# Patient Record
Sex: Male | Born: 2000
Health system: Southern US, Community
[De-identification: ages and names within clinical notes are randomized; demographics above are authoritative.]

## PROBLEM LIST (undated history)

## (undated) DIAGNOSIS — Z91018 Allergy to other foods: Secondary | ICD-10-CM

## (undated) DIAGNOSIS — T783XXA Angioneurotic edema, initial encounter: Secondary | ICD-10-CM

## (undated) DIAGNOSIS — L509 Urticaria, unspecified: Secondary | ICD-10-CM

## (undated) HISTORY — DX: Allergy to other foods: Z91.018

## (undated) HISTORY — DX: Urticaria, unspecified: L50.9

## (undated) HISTORY — DX: Angioneurotic edema, initial encounter: T78.3XXA

## (undated) HISTORY — PX: TYMPANOSTOMY TUBE PLACEMENT: SHX32

---
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
## 2022-02-10 ENCOUNTER — Ambulatory Visit: Payer: BC Managed Care – PPO | Attending: Internal Medicine | Admitting: Internal Medicine

## 2022-02-10 ENCOUNTER — Encounter: Payer: Self-pay | Admitting: Internal Medicine

## 2022-02-10 VITALS — BP 122/67 | HR 57 | Resp 15 | Ht 71.5 in | Wt 167.8 lb

## 2022-02-10 DIAGNOSIS — R079 Chest pain, unspecified: Secondary | ICD-10-CM

## 2022-02-10 DIAGNOSIS — R590 Localized enlarged lymph nodes: Secondary | ICD-10-CM | POA: Insufficient documentation

## 2022-02-10 DIAGNOSIS — M2550 Pain in unspecified joint: Secondary | ICD-10-CM | POA: Insufficient documentation

## 2022-02-10 DIAGNOSIS — Z91018 Allergy to other foods: Secondary | ICD-10-CM

## 2022-02-10 NOTE — Progress Notes (Signed)
Office Visit Note  Patient: Cameron Delacruz             Date of Birth: 03-27-2001           MRN: [id number]             PCP: Sharilyn Sites, MD Referring: Sharilyn Sites, MD Visit Date: 02/10/2022 Occupation: Architect, [employer]  Subjective:  New Patient (Initial Visit) (Joint pain throughout body, some tingling sensations on left side of body.)   History of Present Illness: Cameron Delacruz is a 21 y.o. male here for arthralgias with history of alpha gal related symptoms. He had highly positive test results in 2019 for this.  He avoid meats as recommended for this.  More recently negative testing for [place] or chicken sensitivity based on symptoms.  He still notices symptoms when eating protein even from not mammalian meat animal sources typically has numbness and tingling especially on the left side of his face.  He has been noticing these problems for about 8 months of symptoms.  Also having joint pains what he describes like an ache in his bones most severe in arms and legs.  Feels about 1 to 2 hours of morning stiffness on a daily basis.  He takes NSAIDs some of the time these are beneficial but does not take them all the time hoping to avoid side effects.  He was also sick with mono about 6 months ago and developed significant lymphadenopathy which has not completely resolved.  Activities of Daily Living:  Patient reports morning stiffness for 1-2 hours.   Patient Denies nocturnal pain.  Difficulty dressing/grooming: Denies Difficulty climbing stairs: Denies Difficulty getting out of chair: Denies Difficulty using hands for taps, buttons, cutlery, and/or writing: Denies  Review of Systems  Constitutional:  Positive for fatigue.  HENT:  Negative for mouth sores and mouth dryness.   Respiratory:  Positive for shortness of breath.   Cardiovascular:  Positive for chest pain and palpitations.  Gastrointestinal:  Negative for blood in stool, constipation and diarrhea.  Endocrine:  Negative for increased urination.  Genitourinary:  Negative for involuntary urination.  Musculoskeletal:  Positive for joint pain, joint pain, myalgias, muscle weakness, morning stiffness, muscle tenderness and myalgias. Negative for gait problem and joint swelling.  Skin:  Positive for rash. Negative for color change, hair loss and sensitivity to sunlight.  Allergic/Immunologic: Positive for susceptible to infections.  Neurological:  Positive for dizziness and headaches.  Hematological:  Positive for swollen glands.  Psychiatric/Behavioral:  Negative for depressed mood and sleep disturbance. The patient is nervous/anxious.     PMFS History:  Patient Active Problem List   Diagnosis Date Noted   Polyarthralgia 02/10/2022   Allergy to alpha-gal 02/10/2022   Chest pain 02/10/2022   Lymphadenopathy, cervical 02/10/2022   Allergic urticaria 10/05/2017   Allergy with anaphylaxis due to food 10/05/2017   Seasonal allergic rhinitis 10/05/2017   Dyspnea 10/05/2017    Past Medical History:  Diagnosis Date   Angio-edema    Food allergy    Urticaria     Family History  Problem Relation Age of Onset   Asthma Mother    Allergic rhinitis Mother    Eczema Mother    Past Surgical History:  Procedure Laterality Date   TYMPANOSTOMY TUBE PLACEMENT     Social History   Social History Narrative   Not on file   Immunization History  Administered Date(s) Administered   Influenza Nasal 01/12/2013     Objective: Vital Signs: BP  122/67 (BP Location: Right Arm, Patient Position: Sitting, Cuff Size: Normal)   Pulse (!) 57   Resp 15   Ht 5' 11.5" (1.816 m)   Wt 167 lb 12.8 oz (76.1 kg)   BMI 23.08 kg/m    Physical Exam HENT:     Right Ear: External ear normal.     Left Ear: External ear normal.     Mouth/Throat:     Mouth: Mucous membranes are moist.     Pharynx: Oropharynx is clear.  Eyes:     Conjunctiva/sclera: Conjunctivae normal.  Cardiovascular:     Rate and Rhythm: Normal  rate and regular rhythm.  Pulmonary:     Effort: Pulmonary effort is normal.     Breath sounds: Normal breath sounds.  Musculoskeletal:     Right lower leg: No edema.     Left lower leg: No edema.  Lymphadenopathy:     Cervical: No cervical adenopathy.  Skin:    General: Skin is warm and dry.     Findings: Rash present.     Comments: Flat, blanching erythema flexor side of left elbow Scattered mildly raised bumps on right forearm Callus on palms of both hands  Neurological:     Mental Status: He is alert.  Psychiatric:        Mood and Affect: Mood normal.      Musculoskeletal Exam:  Neck full ROM no tenderness Shoulders full ROM no tenderness or swelling Elbows full ROM no tenderness or swelling Wrists full ROM no tenderness or swelling Fingers full ROM no tenderness or swelling No paraspinal tenderness to palpation over upper and lower back Hip normal internal and external rotation without pain, no tenderness to lateral hip palpation Knees full ROM no tenderness or swelling Ankles full ROM no tenderness or swelling MTPs full ROM no tenderness or swelling   Investigation: No additional findings.  Imaging: No results found.  Recent Labs: No results found for: "WBC", "HGB", "PLT", "NA", "K", "CL", "CO2", "GLUCOSE", "BUN", "CREATININE", "BILITOT", "ALKPHOS", "AST", "ALT", "PROT", "ALBUMIN", "CALCIUM", "GFRAA", "QFTBGOLD", "QFTBGOLDPLUS"  Speciality Comments: No specialty comments available.  Procedures:  No procedures performed Allergies: Alpha-gal   Assessment / Plan:     Visit Diagnoses: Polyarthralgia - Plan: Sedimentation rate, C-reactive protein, Epstein-Barr virus VCA antibody panel, Rocky mtn spotted fvr abs pnl(IgG+IgM), B. Burgdorfi Antibodies by WB, Rheumatoid factor, ANA, C3 and C4, meloxicam (MOBIC) 15 MG tablet  Somewhat nonspecific symptoms with no peripheral synovitis appreciated on exam today.  He does describe several symptoms concerning for  inflammatory disease such as the prolonged morning stiffness episodic swelling.  Will check broad workup including ANA, rheumatoid arthritis antibodies, serum complements sedimentation rate CRP, also EBV panel [facility] spotted fever panel and Lyme panel.  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  If workup is unremarkable may recommend conservative treatment with more long-acting NSAID and observation.  Allergy to alpha-gal  Says that he has been pretty diligent with diet but is still experiencing symptoms even from unrelated food sources.  Chest pain, unspecified type  Chest pain without any red flag symptoms no respiratory problems or dyspnea, not reproducible on physical exam today for chest wall pain.  Lymphadenopathy, cervical  Seems consistent with mononucleosis infection currently lymph nodes are palpable but all less than 1 cm without concerning features.  Orders: Orders Placed This Encounter  Procedures   Sedimentation rate   C-reactive  protein   Epstein-Barr virus VCA antibody panel   Rocky mtn spotted fvr abs pnl(IgG+IgM)   B. Burgdorfi Antibodies by WB   Rheumatoid factor   ANA   C3 and C4   Anti-nuclear ab-titer (ANA titer)   Meds ordered this encounter  Medications   meloxicam (MOBIC) 15 MG tablet    Sig: Take 1 tablet (15 mg total) by mouth daily as needed for pain.    Dispense:  30 tablet    Refill:  2     Follow-Up Instructions: Return in about 3 months (around 05/13/2022) for Multiple joint pains, numbness on face.   Collier Salina, MD  Note - This record has been created using [employer].  Chart creation errors have been sought, but may not always  have been located. Such creation errors do not reflect on  the standard of medical care.

## 2022-02-11 ENCOUNTER — Ambulatory Visit: Payer: BC Managed Care – PPO | Admitting: Internal Medicine

## 2022-02-13 LAB — B. BURGDORFI ANTIBODIES BY WB

## 2022-02-13 LAB — EPSTEIN-BARR VIRUS VCA ANTIBODY PANEL
EBV NA IgG: 571 U/mL — ABNORMAL HIGH
EBV VCA IgG: 207 U/mL — ABNORMAL HIGH
EBV VCA IgM: 45.6 U/mL — ABNORMAL HIGH

## 2022-02-13 LAB — ANA: Anti Nuclear Antibody (ANA): POSITIVE — AB

## 2022-02-13 LAB — C-REACTIVE PROTEIN: CRP: 0.4 mg/L (ref <8.0)

## 2022-02-13 LAB — ROCKY MTN SPOTTED FVR ABS PNL(IGG+IGM)
RMSF IgG: NOT DETECTED
RMSF IgM: NOT DETECTED

## 2022-02-13 LAB — C3 AND C4
C3 Complement: 104 mg/dL (ref 82–185)
C4 Complement: 20 mg/dL (ref 15–53)

## 2022-02-13 LAB — RHEUMATOID FACTOR: Rhuematoid fact SerPl-aCnc: <14 IU/mL (ref <14)

## 2022-02-13 LAB — ANTI-NUCLEAR AB-TITER (ANA TITER): ANA Titer 1: 1:80 {titer} — ABNORMAL HIGH

## 2022-02-13 LAB — SEDIMENTATION RATE: Sed Rate: 28 mm/h — ABNORMAL HIGH (ref 0–15)

## 2022-02-13 MED ORDER — MELOXICAM 15 MG PO TABS: 15.0000 mg | ORAL_TABLET | Freq: Every day | ORAL | 2 refills | Status: AC | PRN

## 2022-02-13 NOTE — Progress Notes (Signed)
Lab results look consistent with recent mono infection from earlier this year. Negative for tick borne illnesses. He has a borderline positive ANA test but I think this is less likely indicating an autoimmune disease causing his symptoms. We can recheck for some causes if symptoms are still the same or worse in a few months as planned. I will send a prescription for meloxicam in the meantime. This is a longer acting antiinflammatory drug he can try using instead of things like ibuprofen or aleve. Take up to once daily as needed. He should take this with food to avoid risk of stomach irritation.

## 2022-05-14 ENCOUNTER — Ambulatory Visit: Payer: BC Managed Care – PPO | Admitting: Internal Medicine

## 2023-03-10 IMAGING — DX DG CHEST 2V
2 series · 2 of 2 positions shown · non-contrast
Comparison: February 07, 2004

CLINICAL DATA: Chest pain for 3 or 4 months.

EXAM:
CHEST - 2 VIEW

[chest pa]
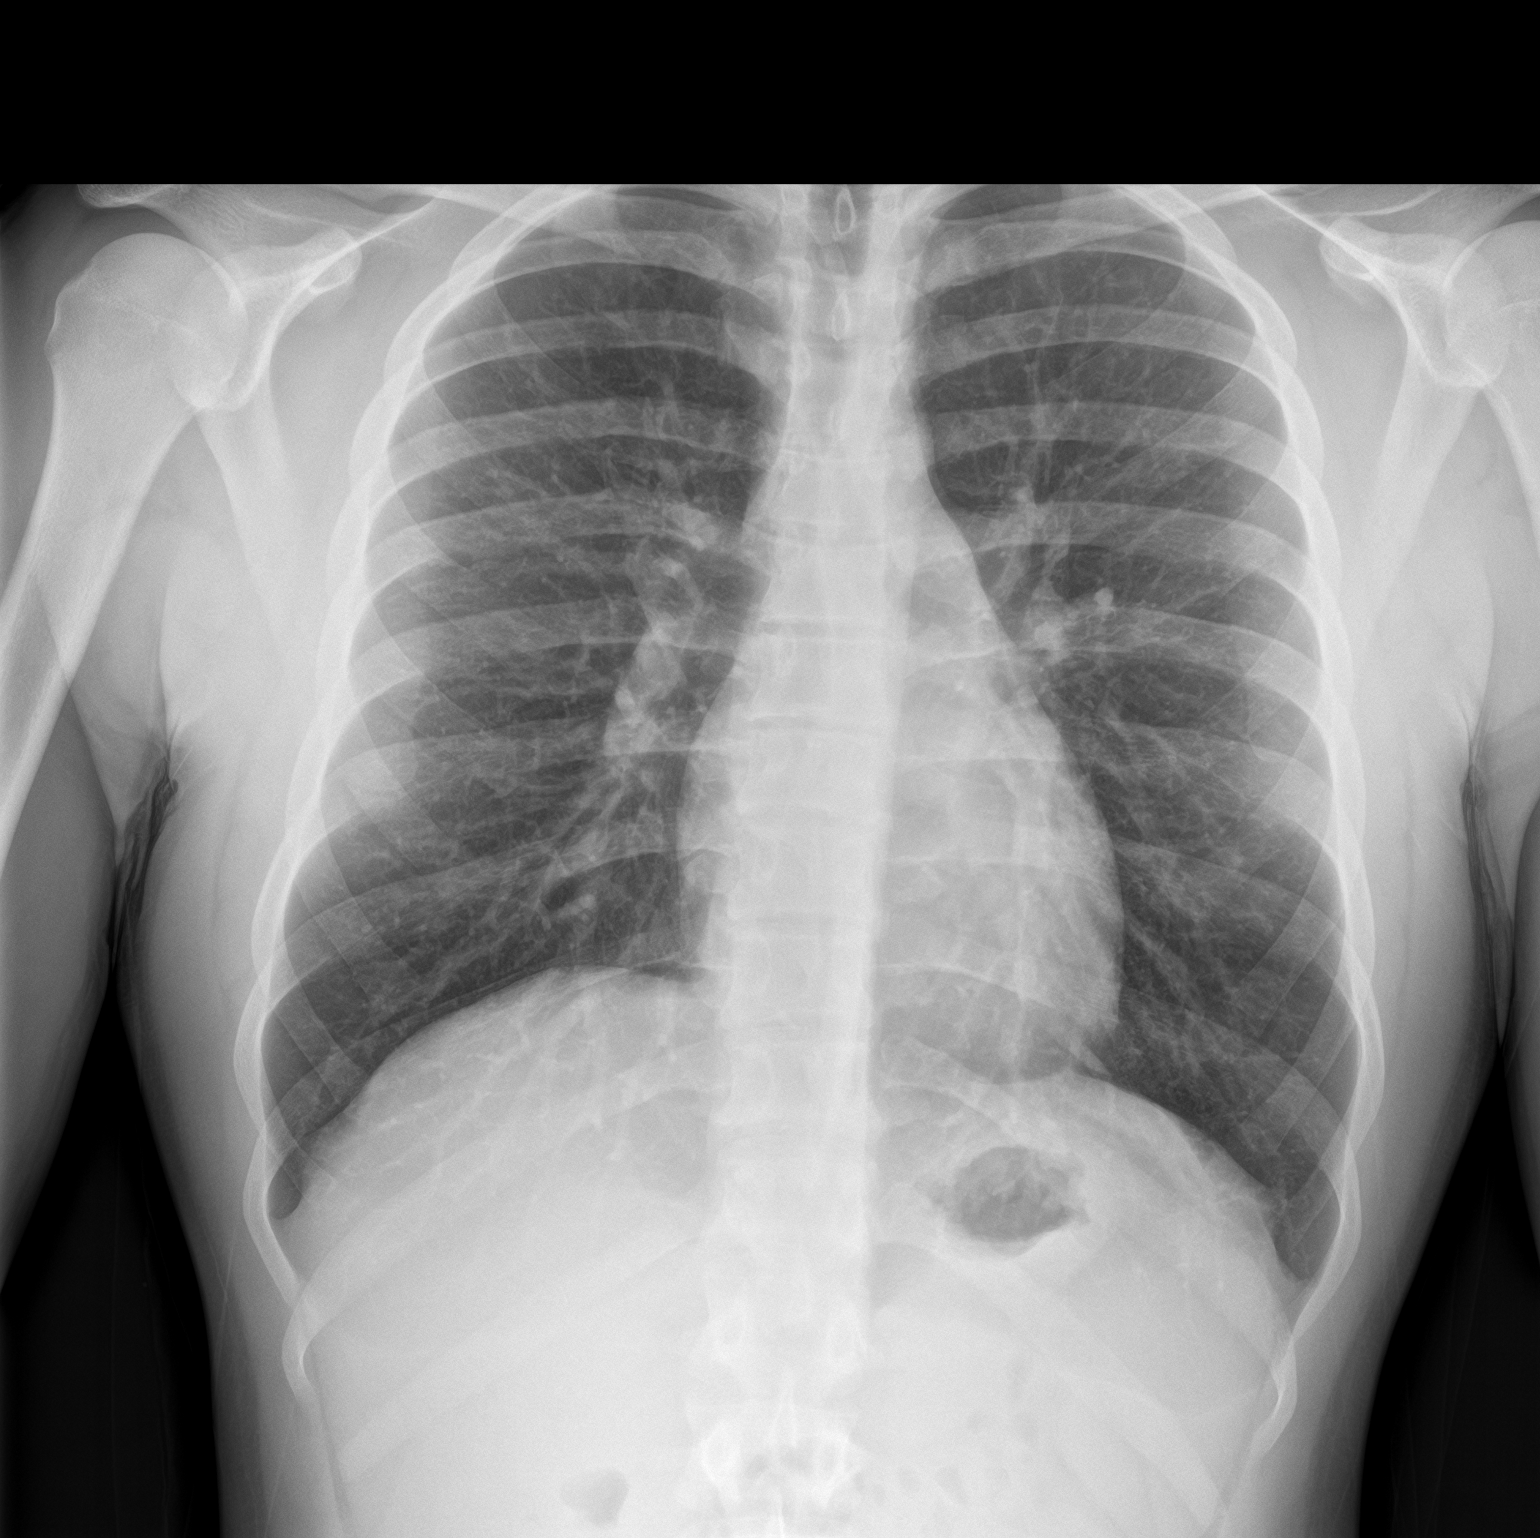

[chest lat]
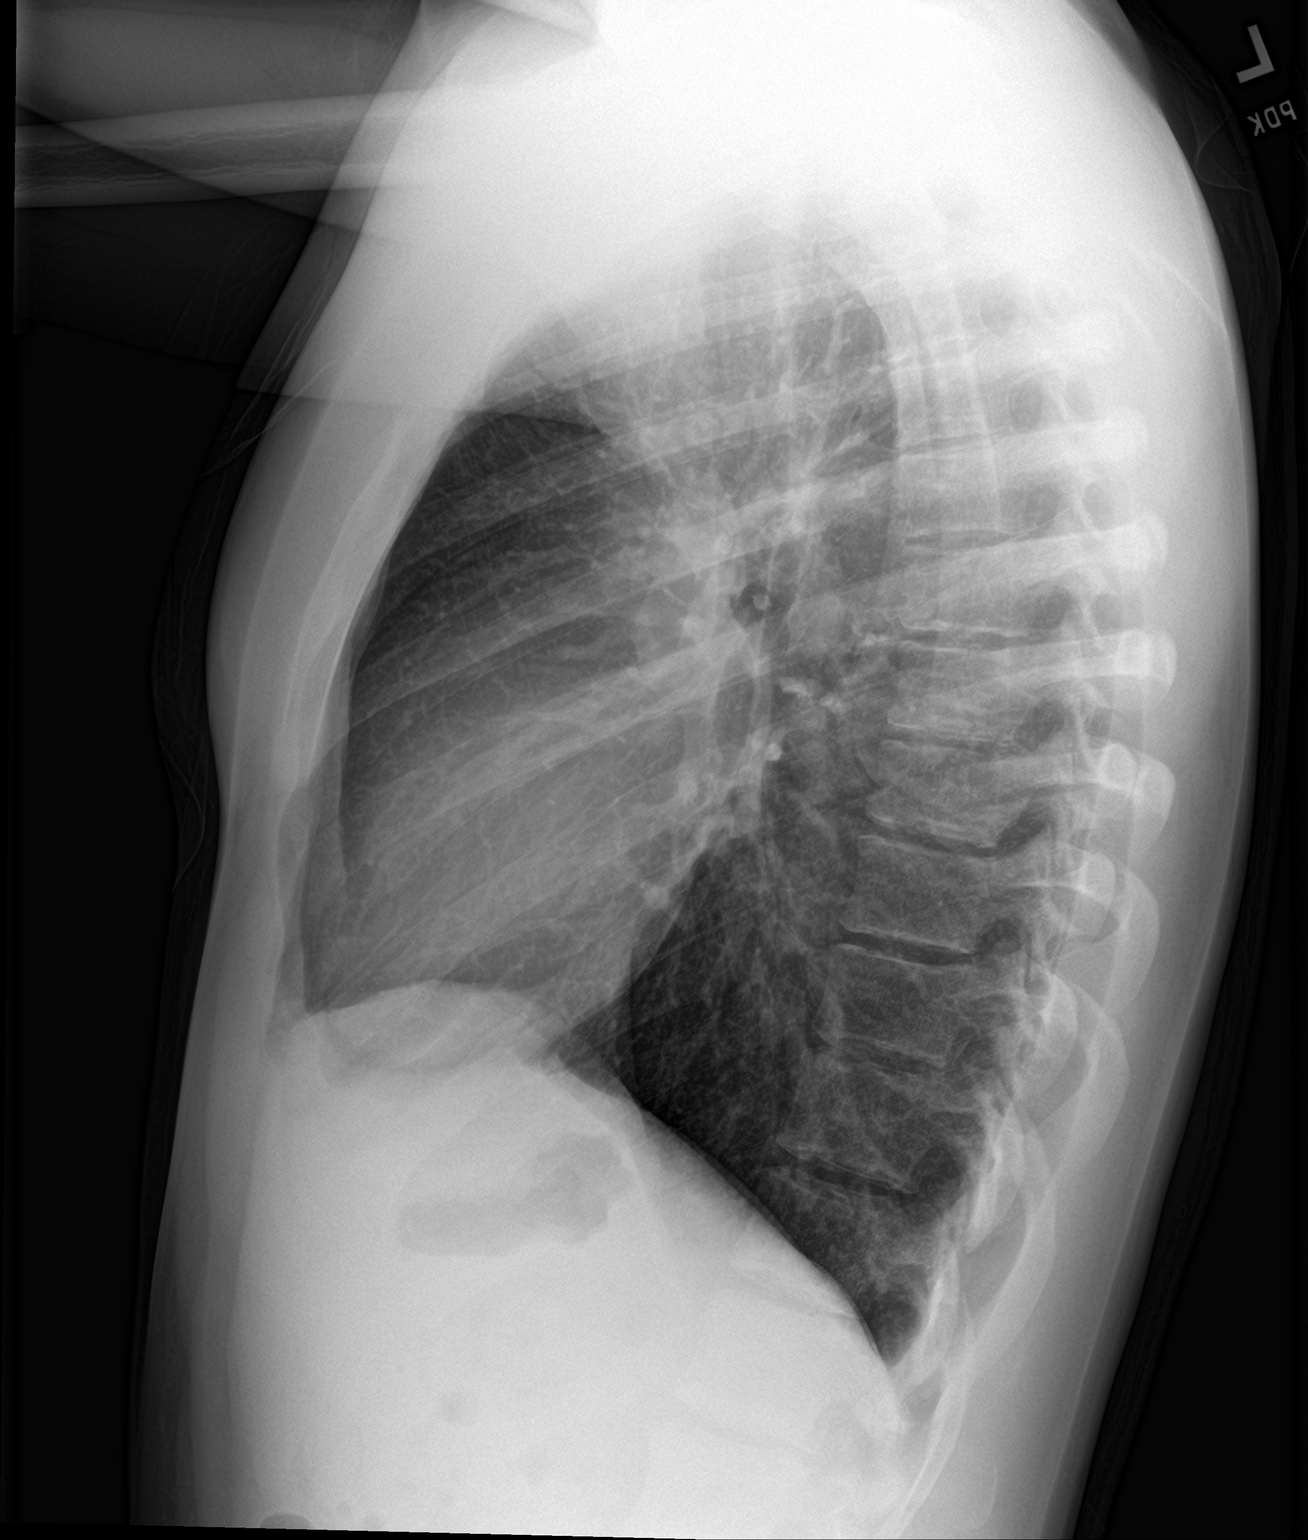

[2 of 2 positions shown; findings below may reference images not displayed]

FINDINGS: The heart size and mediastinal contours are within normal limits.
Both lungs are clear. The visualized skeletal structures are
unremarkable.
IMPRESSION: No active cardiopulmonary disease.

## 2024-04-19 ENCOUNTER — Other Ambulatory Visit: Payer: Self-pay

## 2024-04-19 ENCOUNTER — Encounter: Payer: Self-pay | Admitting: Emergency Medicine

## 2024-04-19 ENCOUNTER — Ambulatory Visit
Admission: EM | Admit: 2024-04-19 | Discharge: 2024-04-19 | Disposition: A | Discharge status: Home / Self Care | Attending: Nurse Practitioner | Admitting: Nurse Practitioner

## 2024-04-19 DIAGNOSIS — R1310 Dysphagia, unspecified: Secondary | ICD-10-CM

## 2024-04-19 NOTE — ED Triage Notes (Signed)
 Pt reports feeling as if food is getting stuck x1 year. Reports most recent episode last night after eating dinner. Pt alert, oriented, airway patent.NAD noted.

## 2024-04-19 NOTE — Discharge Instructions (Addendum)
 A referral has been placed for you to gastroenterology.  Please call their office to schedule an appointment. Make sure you are eating and chewing your food well.  Recommend eating 6 small meals per day instead of 3 large meals.  Also recommend eating at least 2 to 3 hours before bedtime or sitting up at least 2 to 3 hours after you eat before going to bed. Avoid foods that may trigger your symptoms. Go to the emergency department if you develop difficulty swallowing with shortness of breath, difficulty breathing, chest pain, or other concerns. Follow-up as needed.

## 2024-04-19 NOTE — ED Provider Notes (Signed)
 " [facility]    CSN: [id number] Arrival date & time: 04/19/24  1334      History   Chief Complaint Chief Complaint  Patient presents with   Dysphagia    HPI Cameron Delacruz is a 24 y.o. male.   The history is provided by the patient.   Patient presents for complaints of difficulty swallowing food.  Patient states symptoms have been present for the past year.  He states last evening, he tried eating a small amount of spicy foods, and had difficulty swallowing it.  He states that he feels like there is food stuck in his throat and in his upper chest.  He denies shortness of breath, difficulty breathing, abdominal pain, nausea, vomiting, diarrhea.  He states that he does have intermittent sore throat from time to time.  He states that his mother and father have a history of having to have their esophagus stretched.  He has never been evaluated for his symptoms.  Past Medical History:  Diagnosis Date   Angio-edema    Food allergy     Urticaria     Patient Active Problem List   Diagnosis Date Noted   Polyarthralgia 02/10/2022   Allergy  to alpha-gal 02/10/2022   Chest pain 02/10/2022   Lymphadenopathy, cervical 02/10/2022   Allergic urticaria 10/05/2017   Allergy  with anaphylaxis due to food 10/05/2017   Seasonal allergic rhinitis 10/05/2017   Dyspnea 10/05/2017    Past Surgical History:  Procedure Laterality Date   TYMPANOSTOMY TUBE PLACEMENT         Home Medications    Prior to Admission medications  Medication Sig Start Date End Date Taking? Authorizing Provider  cetirizine  (ZYRTEC  ALLERGY ) 10 MG tablet Take 1 tablet (10 mg total) by mouth daily. Patient not taking: Reported on 10/17/2021 02/09/21   Christopher Savannah, PA-C  EPINEPHrine  0.3 mg/0.3 mL IJ SOAJ injection Inject 0.3 mg into the muscle as needed for anaphylaxis. 10/17/21   Jeneal Danita Macintosh, MD  meloxicam  (MOBIC ) 15 MG tablet Take 1 tablet (15 mg total) by mouth daily as needed for pain.  02/13/22   Rice, Lonni ORN, MD    Family History Family History  Problem Relation Age of Onset   Asthma Mother    Allergic rhinitis Mother    Eczema Mother     Social History Social History[1]   Allergies   Alpha-gal   Review of Systems Review of Systems Per HPI  Physical Exam Triage Vital Signs ED Triage Vitals  Encounter Vitals Group     BP 04/19/24 1349 129/71     Girls Systolic BP Percentile --      Girls Diastolic BP Percentile --      Boys Systolic BP Percentile --      Boys Diastolic BP Percentile --      Pulse Rate 04/19/24 1349 63     Resp 04/19/24 1349 20     Temp 04/19/24 1349 98.6 F (37 C)     Temp Source 04/19/24 1349 Oral     SpO2 04/19/24 1349 98 %     Weight --      Height --      Head Circumference --      Peak Flow --      Pain Score 04/19/24 1348 0     Pain Loc --      Pain Education --      Exclude from Growth Chart --    No data found.  Updated Vital Signs  BP 129/71 (BP Location: Right Arm)   Pulse 63   Temp 98.6 F (37 C) (Oral)   Resp 20   SpO2 98%   Visual Acuity Right Eye Distance:   Left Eye Distance:   Bilateral Distance:    Right Eye Near:   Left Eye Near:    Bilateral Near:     Physical Exam Vitals and nursing note reviewed.  Constitutional:      General: He is not in acute distress.    Appearance: Normal appearance.  HENT:     Head: Normocephalic.     Mouth/Throat:     Lips: Pink.     Mouth: Mucous membranes are moist.     Tongue: No lesions.     Pharynx: Oropharynx is clear. Uvula midline. No posterior oropharyngeal erythema.     Comments: Airway is clear and patent, no obstruction present. Eyes:     Extraocular Movements: Extraocular movements intact.     Pupils: Pupils are equal, round, and reactive to light.  Cardiovascular:     Rate and Rhythm: Normal rate and regular rhythm.     Pulses: Normal pulses.     Heart sounds: Normal heart sounds.  Pulmonary:     Effort: Pulmonary effort is normal.  No respiratory distress.     Breath sounds: Normal breath sounds. No stridor. No wheezing, rhonchi or rales.  Abdominal:     General: Bowel sounds are normal.     Palpations: Abdomen is soft.  Musculoskeletal:     Cervical back: Normal range of motion.  Skin:    General: Skin is warm and dry.  Neurological:     General: No focal deficit present.     Mental Status: He is alert and oriented to person, place, and time.  Psychiatric:        Mood and Affect: Mood normal.        Behavior: Behavior normal.      UC Treatments / Results  Labs (all labs ordered are listed, but only abnormal results are displayed) Labs Reviewed - No data to display  EKG   Radiology No results found.  Procedures Procedures (including critical care time)  Medications Ordered in UC Medications - No data to display  Initial Impression / Assessment and Plan / UC Course  I have reviewed the triage vital signs and the nursing notes.  Pertinent labs & imaging results that were available during my care of the patient were reviewed by me and considered in my medical decision making (see chart for details).  Patient presents with a 1 year history of difficulty swallowing.  Advised patient that this clinic has very limited resources regarding his current symptoms.  Given his symptoms and history, we will place referral to gastroenterology for further evaluation.  Patient was advised he will need to call to schedule the appointment.  Supportive care recommendations were provided and discussed with the patient to include chewing his food well, and avoiding large intake of food at 1 time.  Eating 6 smaller meals instead of 3 large meals, eating at least 2 to 3 hours before bedtime, and avoiding spicy foods, or other foods that may trigger symptoms.  Patient was also given strict ER follow-up precautions.  Patient was in agreement with this plan of care and verbalizes understanding.  All questions were answered.   Patient stable for discharge.  Final Clinical Impressions(s) / UC Diagnoses   Final diagnoses:  Dysphagia, unspecified type     Discharge Instructions  A referral has been placed for you to gastroenterology.  Please call their office to schedule an appointment. Make sure you are eating and chewing your food well.  Recommend eating 6 small meals per day instead of 3 large meals.  Also recommend eating at least 2 to 3 hours before bedtime or sitting up at least 2 to 3 hours after you eat before going to bed. Avoid foods that may trigger your symptoms. Go to the emergency department if you develop difficulty swallowing with shortness of breath, difficulty breathing, chest pain, or other concerns. Follow-up as needed.     ED Prescriptions   None    PDMP not reviewed this encounter.     [1]  Social History Tobacco Use   Smoking status: Never    Passive exposure: Never   Smokeless tobacco: Never  Vaping Use   Vaping status: Never Used  Substance Use Topics   Alcohol use: Never   Drug use: Never     Gilmer Etta PARAS, NP 04/19/24 1416  "

## 2024-04-20 ENCOUNTER — Other Ambulatory Visit: Payer: Self-pay

## 2024-04-20 ENCOUNTER — Emergency Department (HOSPITAL_COMMUNITY)
Admission: EM | Admit: 2024-04-20 | Discharge: 2024-04-20 | Disposition: A | Discharge status: Home / Self Care | Attending: Emergency Medicine | Admitting: Emergency Medicine

## 2024-04-20 ENCOUNTER — Encounter (HOSPITAL_COMMUNITY): Payer: Self-pay | Admitting: Emergency Medicine

## 2024-04-20 DIAGNOSIS — R070 Pain in throat: Secondary | ICD-10-CM | POA: Diagnosis present

## 2024-04-20 NOTE — ED Provider Notes (Signed)
 " [place] [facility] AT [facility] Provider Note   CSN: [id number] Arrival date & time: 04/20/24  9068     Patient presents with: Sore Throat   Cameron Delacruz is a 24 y.o. male.   Patient reports that he ate a bean burrito 2 days ago and felt like it got stuck in his throat.  Patient states that he has had the sensation of having something in his throat since.  He is able to drink water.  Patient states he has not ate anything solid he has been drinking protein shakes.  Patient reports that he is concerned that he has a narrow esophagus.  Apparently patient's family members have had strictures that have required dilation.  Patient was seen at urgent care yesterday and advised if symptoms persist to come to the emergency department.  Patient denies any fever he denies any chills.  He reports he was not eating any meat.   Sore Throat       Prior to Admission medications  Medication Sig Start Date End Date Taking? Authorizing Provider  cetirizine  (ZYRTEC  ALLERGY ) 10 MG tablet Take 1 tablet (10 mg total) by mouth daily. Patient not taking: Reported on 10/17/2021 02/09/21   Christopher Savannah, PA-C  EPINEPHrine  0.3 mg/0.3 mL IJ SOAJ injection Inject 0.3 mg into the muscle as needed for anaphylaxis. 10/17/21   Jeneal Danita Macintosh, MD  meloxicam  (MOBIC ) 15 MG tablet Take 1 tablet (15 mg total) by mouth daily as needed for pain. 02/13/22   Rice, Lonni ORN, MD    Allergies: Alpha-gal    Review of Systems  All other systems reviewed and are negative.   Updated Vital Signs BP (!) 121/52   Pulse 66   Temp 98 F (36.7 C) (Oral)   Resp 19   SpO2 96%   Physical Exam Vitals and nursing note reviewed.  Constitutional:      Appearance: He is well-developed.  HENT:     Head: Normocephalic.     Mouth/Throat:     Mouth: Mucous membranes are moist.     Pharynx: No posterior oropharyngeal erythema.  Cardiovascular:     Rate and Rhythm: Normal rate.  Pulmonary:      Effort: Pulmonary effort is normal.  Abdominal:     General: There is no distension.  Musculoskeletal:        General: Normal range of motion.     Cervical back: Normal range of motion.  Skin:    General: Skin is warm.  Neurological:     General: No focal deficit present.     Mental Status: He is alert and oriented to person, place, and time.     (all labs ordered are listed, but only abnormal results are displayed) Labs Reviewed - No data to display  EKG: None  Radiology: No results found.   Procedures   Medications Ordered in the ED - No data to display                                  Medical Decision Making Patient complains of feeling like he has something stuck in his throat after eating a bean burrito 2 days ago.  He has been able to drink fluids without difficulty.  Risk Risk Details: Patient is able to drink water.  He was able to tolerate soda and eat applesauce.  Patient is advised that he should follow-up with gastroenterology.  He is advised to eat a soft diet and chew his food well.  He is advised to return to the emergency department if any problems.        Final diagnoses:  Throat discomfort    ED Discharge Orders     None      An After Visit Summary was printed and given to the patient.     Selena Swaminathan K, PA-C 04/20/24 2003    Yolande Lamar BROCKS, MD 04/21/24 1805  "

## 2024-04-20 NOTE — Discharge Instructions (Signed)
 Return if any problems.  Soft food, chew everything well.

## 2024-04-20 NOTE — ED Triage Notes (Signed)
 Pt states ate a bean burrito 2 days ago and feels like something stuck in throat since. Has not tried to eat but is able to swallow fluids. Nad.

## 2024-04-20 NOTE — ED Notes (Signed)
 Pt provided with apple sauce. Pt said he did fine with the coke provided.

## 2024-05-29 ENCOUNTER — Ambulatory Visit (INDEPENDENT_AMBULATORY_CARE_PROVIDER_SITE_OTHER): Admitting: Gastroenterology
